# Patient Record
Sex: Female | Born: 1970 | Race: White | Hispanic: No | Marital: Married | Smoking: Current every day smoker
Health system: Southern US, Community
[De-identification: ages and names within clinical notes are randomized; demographics above are authoritative.]

## PROBLEM LIST (undated history)

## (undated) DIAGNOSIS — R519 Headache, unspecified: Secondary | ICD-10-CM

## (undated) DIAGNOSIS — C55 Malignant neoplasm of uterus, part unspecified: Secondary | ICD-10-CM

## (undated) DIAGNOSIS — C801 Malignant (primary) neoplasm, unspecified: Secondary | ICD-10-CM

## (undated) DIAGNOSIS — R51 Headache: Secondary | ICD-10-CM

## (undated) HISTORY — PX: ANKLE SURGERY: SHX546

## (undated) HISTORY — PX: ABDOMINAL HYSTERECTOMY: SHX81

---

## 2016-01-13 ENCOUNTER — Encounter (HOSPITAL_COMMUNITY): Payer: Self-pay | Admitting: Family Medicine

## 2016-01-13 ENCOUNTER — Emergency Department (HOSPITAL_COMMUNITY): Payer: Self-pay

## 2016-01-13 ENCOUNTER — Emergency Department (HOSPITAL_COMMUNITY)
Admission: EM | Admit: 2016-01-13 | Discharge: 2016-01-13 | Disposition: A | Discharge status: Home / Self Care | Payer: Self-pay | Attending: Emergency Medicine | Admitting: Emergency Medicine

## 2016-01-13 DIAGNOSIS — R1031 Right lower quadrant pain: Secondary | ICD-10-CM | POA: Insufficient documentation

## 2016-01-13 DIAGNOSIS — A5900 Urogenital trichomoniasis, unspecified: Secondary | ICD-10-CM | POA: Insufficient documentation

## 2016-01-13 DIAGNOSIS — Z8542 Personal history of malignant neoplasm of other parts of uterus: Secondary | ICD-10-CM | POA: Insufficient documentation

## 2016-01-13 DIAGNOSIS — A599 Trichomoniasis, unspecified: Secondary | ICD-10-CM

## 2016-01-13 DIAGNOSIS — F17210 Nicotine dependence, cigarettes, uncomplicated: Secondary | ICD-10-CM | POA: Insufficient documentation

## 2016-01-13 HISTORY — DX: Malignant (primary) neoplasm, unspecified: C80.1

## 2016-01-13 HISTORY — DX: Headache: R51

## 2016-01-13 HISTORY — DX: Headache, unspecified: R51.9

## 2016-01-13 HISTORY — DX: Malignant neoplasm of uterus, part unspecified: C55

## 2016-01-13 LAB — COMPREHENSIVE METABOLIC PANEL
ALBUMIN: 4.1 g/dL (ref 3.5–5.0)
ALK PHOS: 75 U/L (ref 38–126)
ALT: 14 U/L (ref 14–54)
ANION GAP: 7 (ref 5–15)
AST: 16 U/L (ref 15–41)
BILIRUBIN TOTAL: 0.9 mg/dL (ref 0.3–1.2)
BUN: 14 mg/dL (ref 6–20)
CALCIUM: 8.8 mg/dL — AB (ref 8.9–10.3)
CO2: 26 mmol/L (ref 22–32)
Chloride: 104 mmol/L (ref 101–111)
Creatinine, Ser: 0.62 mg/dL (ref 0.44–1.00)
GFR calc Af Amer: >60 mL/min (ref >60)
GLUCOSE: 107 mg/dL — AB (ref 65–99)
POTASSIUM: 3.5 mmol/L (ref 3.5–5.1)
Sodium: 137 mmol/L (ref 135–145)
TOTAL PROTEIN: 7.4 g/dL (ref 6.5–8.1)

## 2016-01-13 LAB — URINALYSIS, ROUTINE W REFLEX MICROSCOPIC
BILIRUBIN URINE: NEGATIVE
Glucose, UA: NEGATIVE mg/dL
Hgb urine dipstick: NEGATIVE
KETONES UR: NEGATIVE mg/dL
NITRITE: NEGATIVE
Protein, ur: NEGATIVE mg/dL
SPECIFIC GRAVITY, URINE: 1.021 (ref 1.005–1.030)
pH: 6 (ref 5.0–8.0)

## 2016-01-13 LAB — GC/CHLAMYDIA PROBE AMP (~~LOC~~) NOT AT ARMC
Chlamydia: NEGATIVE
Neisseria Gonorrhea: NEGATIVE

## 2016-01-13 LAB — CBC
HCT: 40.8 % (ref 36.0–46.0)
HEMOGLOBIN: 14 g/dL (ref 12.0–15.0)
MCH: 30.8 pg (ref 26.0–34.0)
MCHC: 34.3 g/dL (ref 30.0–36.0)
MCV: 89.7 fL (ref 78.0–100.0)
Platelets: 366 10*3/uL (ref 150–400)
RBC: 4.55 MIL/uL (ref 3.87–5.11)
RDW: 13.8 % (ref 11.5–15.5)
WBC: 10.8 10*3/uL — AB (ref 4.0–10.5)

## 2016-01-13 LAB — LIPASE, BLOOD: Lipase: 18 U/L (ref 11–51)

## 2016-01-13 LAB — URINE MICROSCOPIC-ADD ON

## 2016-01-13 LAB — WET PREP, GENITAL
CLUE CELLS WET PREP: NONE SEEN
SPERM: NONE SEEN
Yeast Wet Prep HPF POC: NONE SEEN

## 2016-01-13 MED ORDER — METOCLOPRAMIDE HCL 5 MG/ML IJ SOLN
10.0000 mg | Freq: Once | INTRAMUSCULAR | Status: AC
  Administered 2016-01-13: 10 mg via INTRAVENOUS
  Filled 2016-01-13: qty 2

## 2016-01-13 MED ORDER — OXYCODONE-ACETAMINOPHEN 5-325 MG PO TABS: 1.0000 | ORAL_TABLET | Freq: Three times a day (TID) | ORAL | 0 refills | Status: AC | PRN

## 2016-01-13 MED ORDER — SODIUM CHLORIDE 0.9 % IV BOLUS (SEPSIS)
1000.0000 mL | Freq: Once | INTRAVENOUS | Status: AC
  Administered 2016-01-13: 1000 mL via INTRAVENOUS

## 2016-01-13 MED ORDER — KETOROLAC TROMETHAMINE 30 MG/ML IJ SOLN
30.0000 mg | Freq: Once | INTRAMUSCULAR | Status: AC
  Administered 2016-01-13: 30 mg via INTRAVENOUS
  Filled 2016-01-13: qty 1

## 2016-01-13 MED ORDER — IOPAMIDOL (ISOVUE-300) INJECTION 61%
INTRAVENOUS | Status: AC
  Administered 2016-01-13: 07:00:00
  Filled 2016-01-13: qty 100

## 2016-01-13 MED ORDER — IOPAMIDOL (ISOVUE-300) INJECTION 61%
100.0000 mL | Freq: Once | INTRAVENOUS | Status: AC | PRN
  Administered 2016-01-13: 100 mL via INTRAVENOUS

## 2016-01-13 MED ORDER — METRONIDAZOLE 500 MG PO TABS: 500.0000 mg | ORAL_TABLET | Freq: Two times a day (BID) | ORAL | 0 refills | Status: AC

## 2016-01-13 MED ORDER — SODIUM CHLORIDE 0.9 % IJ SOLN
INTRAMUSCULAR | Status: AC
  Administered 2016-01-13: 07:00:00
  Filled 2016-01-13: qty 50

## 2016-01-13 MED ORDER — DIPHENHYDRAMINE HCL 50 MG/ML IJ SOLN
12.5000 mg | Freq: Once | INTRAMUSCULAR | Status: AC
  Administered 2016-01-13: 12.5 mg via INTRAVENOUS
  Filled 2016-01-13: qty 1

## 2016-01-13 NOTE — ED Provider Notes (Signed)
[facility] DEPT Provider Note   CSN: [id number] Arrival date & time: 01/13/16  0205     History   Chief Complaint Chief Complaint  Patient presents with  . Headache  . Abdominal Pain    HPI Wanda Mills is a 45 y.o. female.  Is a 45 year old female who presents with migraine headache for the past 2 days and also while waiting in the emergency department waiting room she develop right lower quadrant pain that has worsened in severity associated with nausea. She is sexually active with one partner she has not had a menstrual cycle and 4 years she desired any vaginal discharge or bleeding no dysuria constipation or diarrhea      Past Medical History:  Diagnosis Date  . Cancer ([place])   . Generalized headaches   . Uterine cancer ([place])     There are no active problems to display for this patient.   Past Surgical History:  Procedure Laterality Date  . ABDOMINAL HYSTERECTOMY    . ANKLE SURGERY Right     OB History    No data available       Home Medications    Prior to Admission medications   Not on File    Family History History reviewed. No pertinent family history.  Social History Social History  Substance Use Topics  . Smoking status: Current Every Day Smoker    Packs/day: 0.10    Types: Cigarettes  . Smokeless tobacco: Never Used  . Alcohol use No     Allergies   Patient has no allergy information on record.   Review of Systems Review of Systems  Constitutional: Negative for fever.  Gastrointestinal: Positive for abdominal pain and nausea. Negative for constipation, diarrhea and vomiting.  Genitourinary: Negative for dysuria, vaginal bleeding and vaginal discharge.  Neurological: Positive for headaches.  All other systems reviewed and are negative.    Physical Exam Updated Vital Signs BP 139/79 (BP Location: Left Arm)   Pulse 92   Temp 97.6 F (36.4 C) (Oral)   Resp 20   Ht 5\' 4"  (1.626 m)   Wt 68 kg   SpO2 99%   BMI 25.75  kg/m   Physical Exam  Constitutional: She appears well-developed and well-nourished.  HENT:  Head: Normocephalic.  Eyes: Pupils are equal, round, and reactive to light.  Neck: Normal range of motion.  Cardiovascular: Normal rate.   Pulmonary/Chest: Effort normal.  Abdominal: Soft. She exhibits no distension. There is tenderness.  Genitourinary: No vaginal discharge found.  Musculoskeletal: Normal range of motion.  Neurological: She is alert.  Skin: Skin is warm.  Psychiatric: She has a normal mood and affect.  Nursing note and vitals reviewed.    ED Treatments / Results  Labs (all labs ordered are listed, but only abnormal results are displayed) Labs Reviewed  COMPREHENSIVE METABOLIC PANEL - Abnormal; Notable for the following:       Result Value   Glucose, Bld 107 (*)    Calcium 8.8 (*)    All other components within normal limits  CBC - Abnormal; Notable for the following:    WBC 10.8 (*)    All other components within normal limits  URINALYSIS, ROUTINE W REFLEX MICROSCOPIC (NOT AT [facility]) - Abnormal; Notable for the following:    APPearance CLOUDY (*)    Leukocytes, UA SMALL (*)    All other components within normal limits  URINE MICROSCOPIC-ADD ON - Abnormal; Notable for the following:    Squamous Epithelial / LPF 6-30 (*)  Bacteria, UA RARE (*)    All other components within normal limits  WET PREP, GENITAL  LIPASE, BLOOD  GC/CHLAMYDIA PROBE AMP ([place]) NOT AT [facility]    EKG  EKG Interpretation None       Radiology No results found.  Procedures Procedures (including critical care time)  Medications Ordered in ED Medications  sodium chloride 0.9 % bolus 1,000 mL (not administered)  diphenhydrAMINE (BENADRYL) injection 12.5 mg (not administered)  ketorolac (TORADOL) 30 MG/ML injection 30 mg (not administered)  metoCLOPramide (REGLAN) injection 10 mg (not administered)     Initial Impression / Assessment and Plan / ED Course  I have reviewed  the triage vital signs and the nursing notes.  Pertinent labs & imaging results that were available during my care of the patient were reviewed by me and considered in my medical decision making (see chart for details).  Clinical Course    Patient's pelvic exam I feel it is imperative that we obtain a CT scan of her abdomen to rule out acute appendicitis    Final Clinical Impressions(s) / ED Diagnoses   Final diagnoses:  None    New Prescriptions New Prescriptions   No medications on file     Junius Creamer, NP 01/13/16 0559    Veryl Speak, MD 01/13/16 [phone]

## 2016-01-13 NOTE — Discharge Instructions (Signed)
Please read and follow all provided instructions.  Your diagnoses today include:  1. Trichimoniasis   2. Right lower quadrant abdominal pain     Tests performed today include: Blood counts and electrolytes Blood tests to check liver and kidney function Blood tests to check pancreas function Urine test to look for infection and pregnancy (in women) Vital signs. See below for your results today.   Medications prescribed:   Take any prescribed medications only as directed.  Home care instructions:  Follow any educational materials contained in this packet.  Follow-up instructions: Please follow-up with your primary care provider in the next 2 days for further evaluation of your symptoms.    Return instructions:  SEEK IMMEDIATE MEDICAL ATTENTION IF: The pain does not go away or becomes severe  A temperature above 101F develops  Repeated vomiting occurs (multiple episodes)  The pain becomes localized to portions of the abdomen. The right side could possibly be appendicitis. In an adult, the left lower portion of the abdomen could be colitis or diverticulitis.  Blood is being passed in stools or vomit (bright red or black tarry stools)  You develop chest pain, difficulty breathing, dizziness or fainting, or become confused, poorly responsive, or inconsolable (young children) If you have any other emergent concerns regarding your health  Additional Information: Abdominal (belly) pain can be caused by many things. Your caregiver performed an examination and possibly ordered blood/urine tests and imaging (CT scan, x-rays, ultrasound). Many cases can be observed and treated at home after initial evaluation in the emergency department. Even though you are being discharged home, abdominal pain can be unpredictable. Therefore, you need a repeated exam if your pain does not resolve, returns, or worsens. Most patients with abdominal pain don't have to be admitted to the hospital or have surgery,  but serious problems like appendicitis and gallbladder attacks can start out as nonspecific pain. Many abdominal conditions cannot be diagnosed in one visit, so follow-up evaluations are very important.  Your vital signs today were: BP 106/83 (BP Location: Right Arm)    Pulse 60    Temp 97.6 F (36.4 C) (Oral)    Resp 20    Ht 5\' 4"  (1.626 m)    Wt 68 kg    SpO2 96%    BMI 25.75 kg/m  If your blood pressure (bp) was elevated above 135/85 this visit, please have this repeated by your doctor within one month. --------------

## 2016-01-13 NOTE — ED Notes (Signed)
In ct  

## 2016-01-13 NOTE — ED Triage Notes (Signed)
Patient is complaining of a headache that started 2 days ago. Associated symptoms are light sensitivity, neck pain, and nausea. Pt reports she has chronic headaches. Also, complains of right lower quad pain that started 2 hours ago.

## 2016-01-13 NOTE — ED Provider Notes (Signed)
  Physical Exam  BP 139/79 (BP Location: Left Arm)   Pulse 92   Temp 97.6 F (36.4 C) (Oral)   Resp 20   Ht 5\' 4"  (1.626 m)   Wt 68 kg   SpO2 99%   BMI 25.75 kg/m   Physical Exam  ED Course  Procedures  MDM Sign out from Junius Creamer, NP  Per previous provider MDM- Is a 45 year old female who presents with migraine headache for the past 2 days and also while waiting in the emergency department waiting room she develop right lower quadrant pain that has worsened in severity associated with nausea. She is sexually active with one partner she has not had a menstrual cycle and 4 years she desired any vaginal discharge or bleeding no dysuria constipation or diarrhea  Concern for Appendicitis  Pending CT Abdomen/Pelvis. GU exam unremarkable per previous provider (No CMT, No Adnexal tenderness)  CBC- Leukocytosis 10.8 UA- Unremarkable Wet Prep- Shows Trich   7:52 AM CT Abdomen/Pelvis IMPRESSION: 1. Suggestion of vague wall thickening along the ascending colon, measuring approximately 5-6 cm in length. Underlying mass cannot be entirely excluded, though this could simply reflect intraluminal contents. Colonoscopy would be helpful for further evaluation, when and as deemed clinically appropriate, to exclude malignancy. 2. Otherwise unremarkable contrast-enhanced CT of the abdomen and Pelvis.  7:52 AM- Pt abdominal pain improved. Headache improved. Will DC home with Flagyl to treat Trich and follow up to GI for Colonoscopy due to CT Scan results.   At time of discharge, Patient is in no acute distress. Vital Signs are stable. Patient is able to ambulate. Patient able to tolerate PO.        Shary Decamp, PA-C 01/13/16 [id number]    Veryl Speak, MD 01/13/16 [phone]

## 2017-12-05 IMAGING — CT CT ABD-PELV W/ CM
2 of 5 series · 15 of 46 positions shown, 17 images · IV contrast (ISOVUE)
Comparison: None.

CLINICAL DATA: Acute onset of right lower quadrant abdominal pain
and nausea. Mild leukocytosis. White blood cells in the urine.
Initial encounter.

EXAM:
CT ABDOMEN AND PELVIS WITH CONTRAST
TECHNIQUE: Multidetector CT imaging of the abdomen and pelvis was performed
using the standard protocol following bolus administration of
intravenous contrast.
CONTRAST:  100mL WZGE2Q-FDD IOPAMIDOL (WZGE2Q-FDD) INJECTION 61%

[Series 2: abd/pel with · axial · 0.71mm/px · z∈[-536,-116]mm · 12 of 96 slices shown, 14 images]
[im 6/96  soft-tissue]
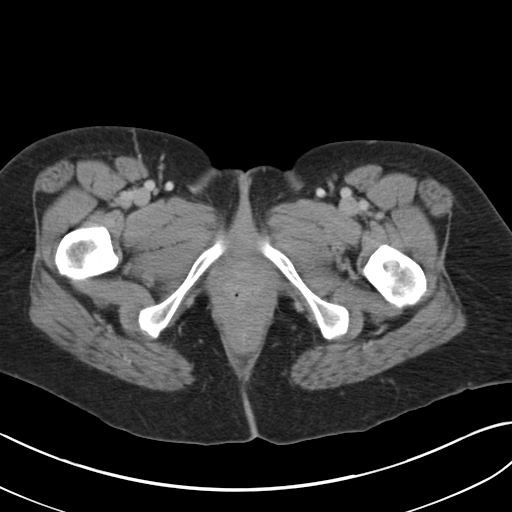
[im 6/96  bone]
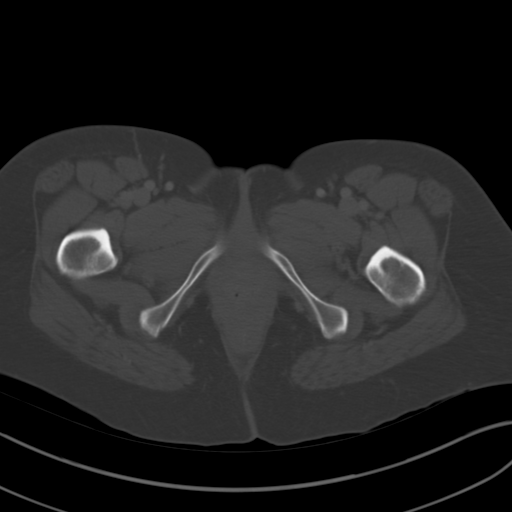
[im 12/96  soft-tissue]
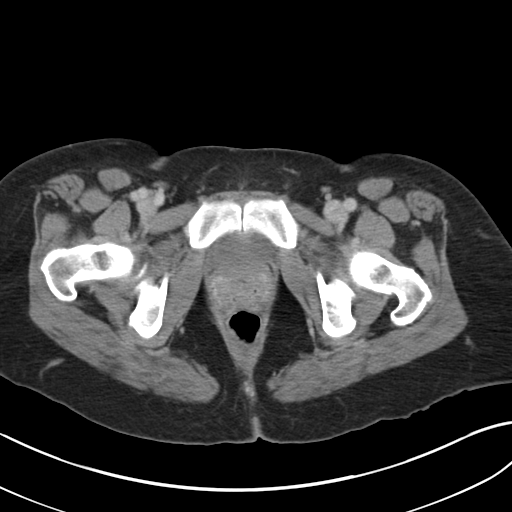
[im 24/96  soft-tissue]
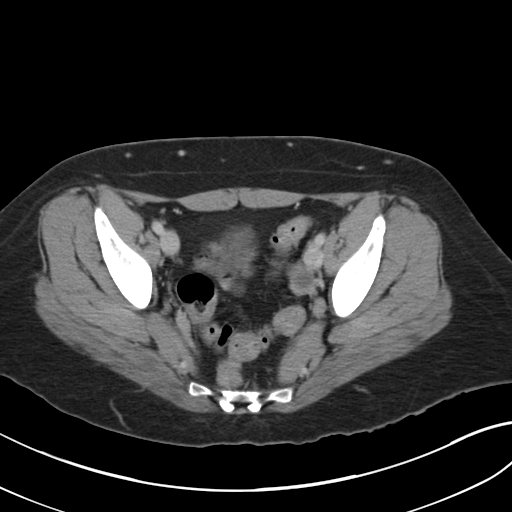
[im 30/96  soft-tissue]
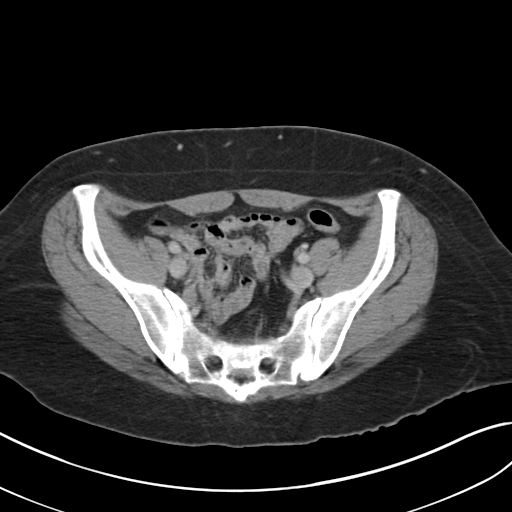
[im 36/96  soft-tissue]
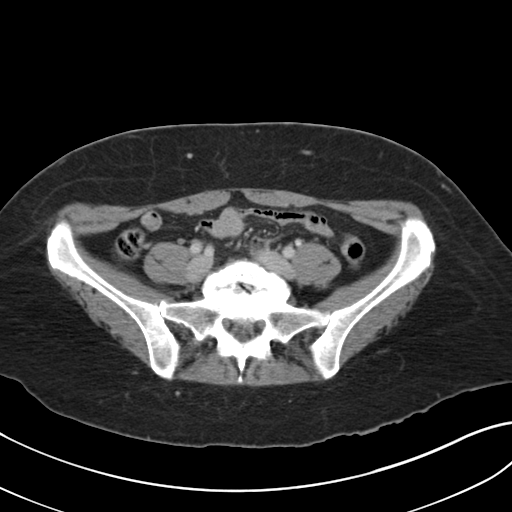
[im 42/96  soft-tissue]
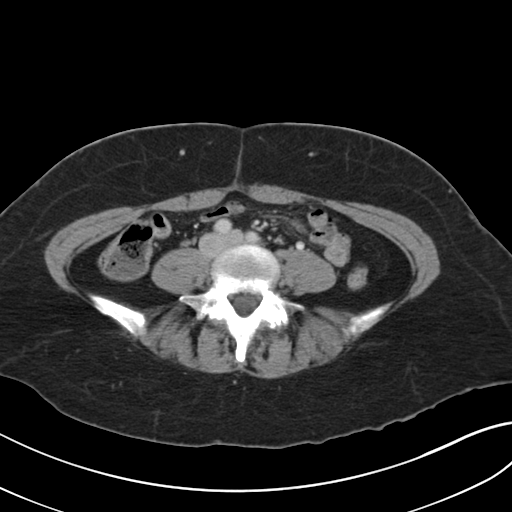
[im 54/96  soft-tissue]
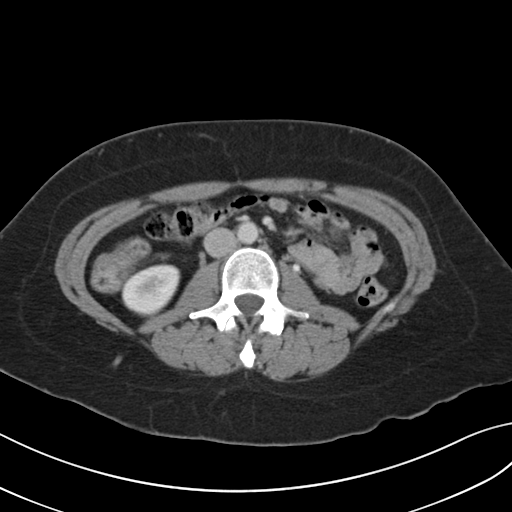
[im 60/96  soft-tissue]
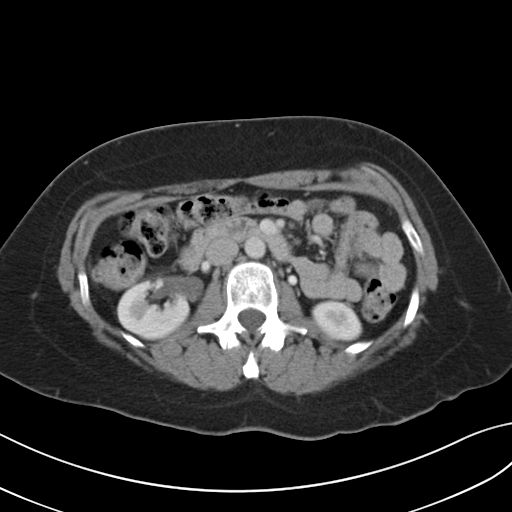
[im 66/96  soft-tissue]
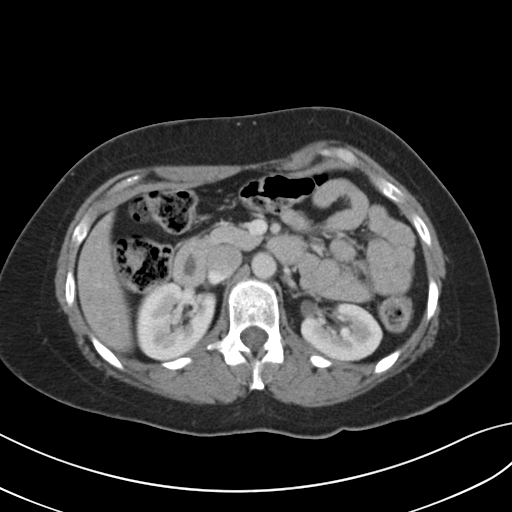
[im 66/96  bone]
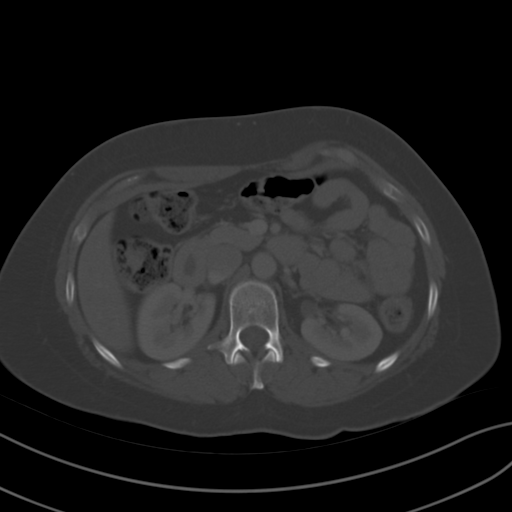
[im 72/96  soft-tissue]
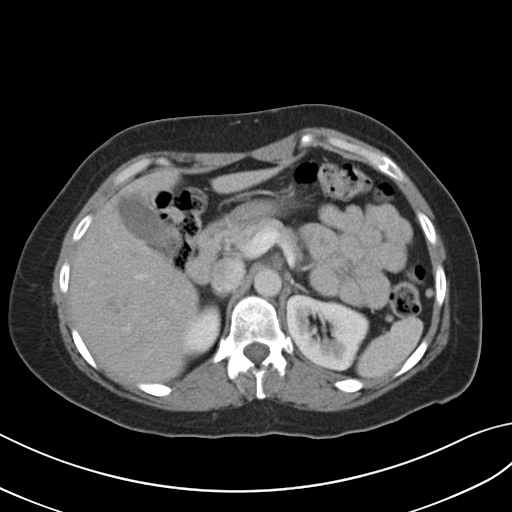
[im 84/96  soft-tissue]
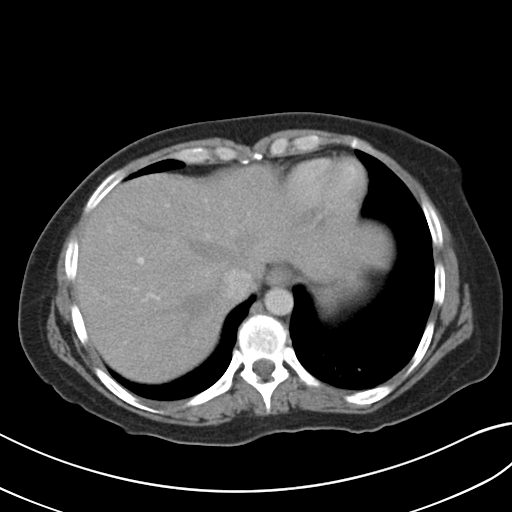
[im 90/96  soft-tissue]
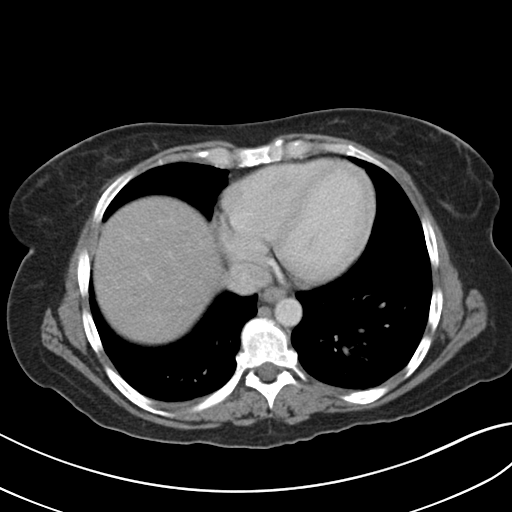

[Series 4: coronal a/|p · coronal · 0.70mm/px · 3 of 111 slices shown]
[im 37/111  soft-tissue]
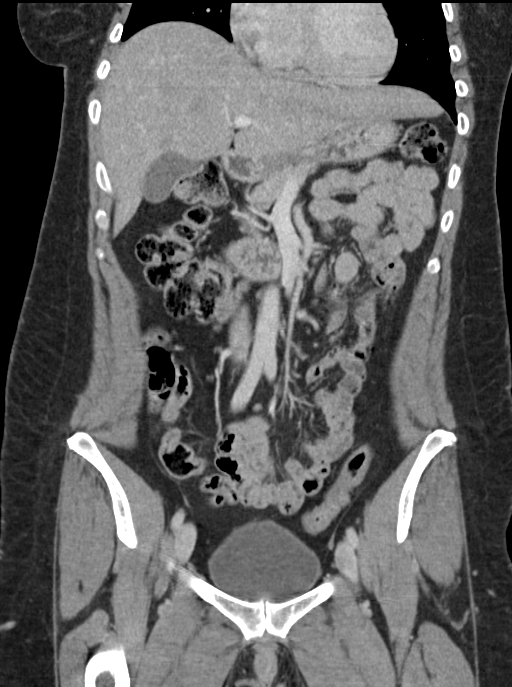
[im 49/111  soft-tissue]
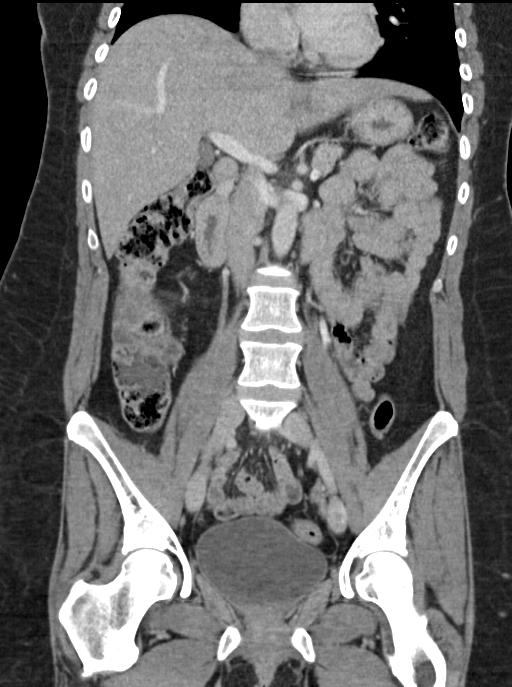
[im 62/111  soft-tissue]
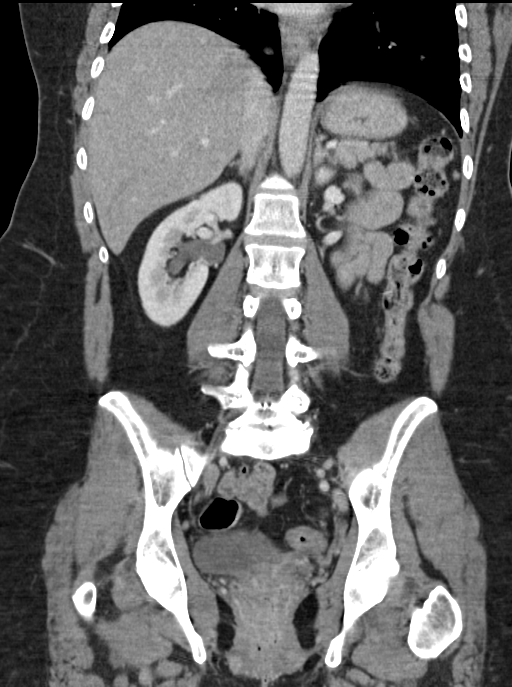

[15 of 46 positions shown; findings below may reference images not displayed]

FINDINGS: Lower chest: The visualized lung bases are grossly clear. The
visualized portions of the mediastinum are unremarkable.

Hepatobiliary: The liver is unremarkable in appearance. The
gallbladder is unremarkable in appearance. The common bile duct
remains normal in caliber.

Pancreas: The pancreas is within normal limits.

Spleen: The spleen is unremarkable in appearance.

Adrenals/Urinary Tract: The adrenal glands are unremarkable in
appearance. The kidneys are within normal limits. There is no
evidence of hydronephrosis. No renal or ureteral stones are
identified. No perinephric stranding is seen.

Stomach/Bowel: The stomach is unremarkable in appearance. The small
bowel is within normal limits. The appendix is normal in caliber,
without evidence of appendicitis.

There is suggestion of vague wall thickening along the ascending
colon, measuring approximately 5-6 cm in length. The colon is
otherwise unremarkable.

Vascular/Lymphatic: The abdominal aorta is unremarkable in
appearance. Minimal calcification is seen along the right common
iliac artery. The inferior vena cava is grossly unremarkable. No
retroperitoneal lymphadenopathy is seen. No pelvic sidewall
lymphadenopathy is identified.

Reproductive: The bladder is mildly distended and grossly
unremarkable. The patient is status post hysterectomy. No suspicious
adnexal masses are seen. The left ovary is grossly unremarkable in
appearance.

Other: No additional soft tissue abnormalities are seen.

Musculoskeletal: No acute osseous abnormalities are identified.
Vacuum phenomenon is noted at L5-S1. The visualized musculature is
unremarkable in appearance.
IMPRESSION: 1. Suggestion of vague wall thickening along the ascending colon,
measuring approximately 5-6 cm in length. Underlying mass cannot be
entirely excluded, though this could simply reflect intraluminal
contents. Colonoscopy would be helpful for further evaluation, when
and as deemed clinically appropriate, to exclude malignancy.
2. Otherwise unremarkable contrast-enhanced CT of the abdomen and
pelvis.
